# Patient Record
Sex: Male | Born: 1955 | Race: Black or African American | Hispanic: No | Marital: Married | Smoking: Never smoker
Health system: Southern US, Community
[De-identification: ages and names within clinical notes are randomized; demographics above are authoritative.]

## PROBLEM LIST (undated history)

## (undated) DIAGNOSIS — I10 Essential (primary) hypertension: Secondary | ICD-10-CM

## (undated) DIAGNOSIS — E7800 Pure hypercholesterolemia, unspecified: Secondary | ICD-10-CM

---

## 2005-12-10 ENCOUNTER — Emergency Department (HOSPITAL_COMMUNITY): Admission: EM | Admit: 2005-12-10 | Discharge: 2005-12-10 | Payer: Self-pay | Admitting: Emergency Medicine

## 2006-12-12 ENCOUNTER — Ambulatory Visit (HOSPITAL_COMMUNITY): Admission: RE | Admit: 2006-12-12 | Discharge: 2006-12-12 | Payer: Self-pay | Admitting: Internal Medicine

## 2009-12-24 ENCOUNTER — Ambulatory Visit (HOSPITAL_COMMUNITY): Admission: RE | Admit: 2009-12-24 | Discharge: 2009-12-24 | Payer: Self-pay | Admitting: Family Medicine

## 2012-10-24 ENCOUNTER — Other Ambulatory Visit (HOSPITAL_COMMUNITY): Payer: Self-pay | Admitting: Chiropractic Medicine

## 2012-10-24 DIAGNOSIS — R52 Pain, unspecified: Secondary | ICD-10-CM

## 2012-10-26 ENCOUNTER — Ambulatory Visit (HOSPITAL_COMMUNITY)
Admission: RE | Admit: 2012-10-26 | Discharge: 2012-10-26 | Disposition: A | Discharge status: Home / Self Care | Payer: 59 | Source: Ambulatory Visit | Attending: Chiropractic Medicine | Admitting: Chiropractic Medicine

## 2012-10-26 DIAGNOSIS — R209 Unspecified disturbances of skin sensation: Secondary | ICD-10-CM | POA: Insufficient documentation

## 2012-10-26 DIAGNOSIS — R52 Pain, unspecified: Secondary | ICD-10-CM

## 2012-10-26 DIAGNOSIS — M25569 Pain in unspecified knee: Secondary | ICD-10-CM | POA: Insufficient documentation

## 2012-10-26 DIAGNOSIS — R29898 Other symptoms and signs involving the musculoskeletal system: Secondary | ICD-10-CM | POA: Insufficient documentation

## 2012-10-26 DIAGNOSIS — S83289A Other tear of lateral meniscus, current injury, unspecified knee, initial encounter: Secondary | ICD-10-CM | POA: Insufficient documentation

## 2012-10-26 DIAGNOSIS — M898X9 Other specified disorders of bone, unspecified site: Secondary | ICD-10-CM | POA: Insufficient documentation

## 2013-04-07 ENCOUNTER — Inpatient Hospital Stay (HOSPITAL_BASED_OUTPATIENT_CLINIC_OR_DEPARTMENT_OTHER)
Admission: EM | Admit: 2013-04-07 | Discharge: 2013-04-09 | DRG: 378 | Disposition: A | Discharge status: Home / Self Care | Payer: 59 | Attending: Internal Medicine | Admitting: Internal Medicine

## 2013-04-07 ENCOUNTER — Encounter (HOSPITAL_BASED_OUTPATIENT_CLINIC_OR_DEPARTMENT_OTHER): Payer: Self-pay

## 2013-04-07 ENCOUNTER — Emergency Department (HOSPITAL_BASED_OUTPATIENT_CLINIC_OR_DEPARTMENT_OTHER): Payer: 59

## 2013-04-07 DIAGNOSIS — K922 Gastrointestinal hemorrhage, unspecified: Principal | ICD-10-CM | POA: Diagnosis present

## 2013-04-07 DIAGNOSIS — Z79899 Other long term (current) drug therapy: Secondary | ICD-10-CM

## 2013-04-07 DIAGNOSIS — K269 Duodenal ulcer, unspecified as acute or chronic, without hemorrhage or perforation: Secondary | ICD-10-CM | POA: Diagnosis present

## 2013-04-07 DIAGNOSIS — K2970 Gastritis, unspecified, without bleeding: Secondary | ICD-10-CM | POA: Diagnosis present

## 2013-04-07 DIAGNOSIS — E538 Deficiency of other specified B group vitamins: Secondary | ICD-10-CM | POA: Diagnosis present

## 2013-04-07 DIAGNOSIS — Z7982 Long term (current) use of aspirin: Secondary | ICD-10-CM

## 2013-04-07 DIAGNOSIS — K921 Melena: Secondary | ICD-10-CM | POA: Diagnosis present

## 2013-04-07 DIAGNOSIS — I10 Essential (primary) hypertension: Secondary | ICD-10-CM | POA: Diagnosis present

## 2013-04-07 DIAGNOSIS — K259 Gastric ulcer, unspecified as acute or chronic, without hemorrhage or perforation: Secondary | ICD-10-CM | POA: Diagnosis present

## 2013-04-07 DIAGNOSIS — E785 Hyperlipidemia, unspecified: Secondary | ICD-10-CM | POA: Diagnosis present

## 2013-04-07 DIAGNOSIS — R000 Tachycardia, unspecified: Secondary | ICD-10-CM | POA: Diagnosis present

## 2013-04-07 DIAGNOSIS — D62 Acute posthemorrhagic anemia: Secondary | ICD-10-CM | POA: Diagnosis present

## 2013-04-07 HISTORY — DX: Pure hypercholesterolemia, unspecified: E78.00

## 2013-04-07 HISTORY — DX: Essential (primary) hypertension: I10

## 2013-04-07 LAB — COMPREHENSIVE METABOLIC PANEL
AST: 18 U/L (ref 0–37)
Albumin: 3.9 g/dL (ref 3.5–5.2)
Alkaline Phosphatase: 48 U/L (ref 39–117)
Calcium: 9.2 mg/dL (ref 8.4–10.5)
GFR calc Af Amer: >90 mL/min (ref >90)
GFR calc non Af Amer: 80 mL/min — ABNORMAL LOW (ref >90)
Potassium: 4 mEq/L (ref 3.5–5.1)

## 2013-04-07 LAB — CBC WITH DIFFERENTIAL/PLATELET
Basophils Absolute: 0 10*3/uL (ref 0.0–0.1)
Basophils Relative: 0 % (ref 0–1)
Eosinophils Absolute: 0.1 10*3/uL (ref 0.0–0.7)
Eosinophils Relative: 0 % (ref 0–5)
Lymphocytes Relative: 16 % (ref 12–46)
MCHC: 35 g/dL (ref 30.0–36.0)
MCV: 90.5 fL (ref 78.0–100.0)
Monocytes Absolute: 0.9 10*3/uL (ref 0.1–1.0)
Neutro Abs: 9.4 10*3/uL — ABNORMAL HIGH (ref 1.7–7.7)
Neutrophils Relative %: 76 % (ref 43–77)
Platelets: 209 10*3/uL (ref 150–400)
RDW: 11.6 % (ref 11.5–15.5)

## 2013-04-07 MED ORDER — SODIUM CHLORIDE 0.9 % IV SOLN
80.0000 mg | Freq: Once | INTRAVENOUS | Status: AC
  Administered 2013-04-07: 80 mg via INTRAVENOUS
  Filled 2013-04-07: qty 80

## 2013-04-07 MED ORDER — SODIUM CHLORIDE 0.9 % IV SOLN
8.0000 mg/h | INTRAVENOUS | Status: DC
  Administered 2013-04-07 – 2013-04-08 (×3): 8 mg/h via INTRAVENOUS
  Filled 2013-04-07 (×5): qty 80

## 2013-04-07 MED ORDER — SODIUM CHLORIDE 0.9 % IV BOLUS (SEPSIS)
1000.0000 mL | Freq: Once | INTRAVENOUS | Status: AC
  Administered 2013-04-07: 1000 mL via INTRAVENOUS

## 2013-04-07 NOTE — ED Notes (Signed)
Patient transported to X-ray via stretcher per tech. 

## 2013-04-07 NOTE — ED Provider Notes (Signed)
CSN: [id number]     Arrival date & time 04/07/13  2026 History  This chart was scribed for Gerhard Munch, MD by Arlan Organ, ED Scribe. This patient was seen in room MH05/MH05 and the patient's care was started at 8:38 PM.    Chief Complaint  Patient presents with  . Dizziness  . Near Syncope    The history is provided by the patient. No language interpreter was used.   HPI Comments: Corey Wright is a 57 y.o. male who presents to the Emergency Department complaining of upper and lower abdominal pain that started 1 week ago with associated black stool that started 4 days ago. Pt states he has felt tired for the past week. Pt states he had dizziness and a near syncope episode today. Pt denies dyspnea, fever, confusion, swelling, rash, visual disturbance, and CP. Pt currently takes 81 mg of aspirin every day.    Past Medical History  Diagnosis Date  . Hypertension   . Hypercholesteremia    History reviewed. No pertinent past surgical history. No family history on file. History  Substance Use Topics  . Smoking status: Never Smoker   . Smokeless tobacco: Not on file  . Alcohol Use: No    Review of Systems  Constitutional:       Per HPI, otherwise negative  HENT:       Per HPI, otherwise negative  Respiratory:       Per HPI, otherwise negative  Cardiovascular:       Per HPI, otherwise negative  Gastrointestinal: Negative for vomiting.  Endocrine:       Negative aside from HPI  Genitourinary:       Neg aside from HPI   Musculoskeletal:       Per HPI, otherwise negative  Skin: Negative.   Neurological: Negative for syncope.    Allergies  Review of patient's allergies indicates no known allergies.  Home Medications   Current Outpatient Rx  Name  Route  Sig  Dispense  Refill  . lisinopril-hydrochlorothiazide (PRINZIDE,ZESTORETIC) 10-12.5 MG per tablet   Oral   Take 1 tablet by mouth daily.         Marland Kitchen lovastatin (MEVACOR) 20 MG tablet   Oral   Take 20 mg by  mouth at bedtime.         . tadalafil (CIALIS) 5 MG tablet   Oral   Take 5 mg by mouth daily as needed for erectile dysfunction.          BP 134/84  Pulse 110  Temp(Src) 98.2 F (36.8 C) (Oral)  Resp 18  Ht 5\' 10"  (1.778 m)  Wt 185 lb (83.915 kg)  BMI 26.54 kg/m2 Physical Exam  Nursing note and vitals reviewed. Constitutional: He is oriented to person, place, and time. He appears well-developed. No distress.  HENT:  Head: Normocephalic and atraumatic.  Eyes: Conjunctivae and EOM are normal.  Cardiovascular: Regular rhythm.  Tachycardia present.   Pulmonary/Chest: Effort normal. No stridor. No respiratory distress.  Abdominal: He exhibits no distension.  Genitourinary:  hemoccult grossly negative, but Hemoccult positive  Musculoskeletal: He exhibits no edema.  Neurological: He is alert and oriented to person, place, and time.  Skin: Skin is warm and dry.  Psychiatric: He has a normal mood and affect.    ED Course  Procedures (including critical care time)  DIAGNOSTIC STUDIES: Oxygen Saturation is 98% on ra, nml by my interpretation.    COORDINATION OF CARE: 8:45 PM-Discussed treatment plan which includes  rectal exam. Pt agreed to plan.   10:50 PM Patient in no distress.  ECG w SR, rate 88, unremarkable.   Labs Review Labs Reviewed - No data to display Imaging Review No results found.  MDM  No diagnosis found. I personally performed the services described in this documentation, which was scribed in my presence. The recorded information has been reviewed and is accurate.  This patient presents with fatigue, several days of black stool, and one episode of near syncope.  On exam he is awake alert, but tachycardic.  The patient has Hemoccult positive stool, anemia, and although he has one prior colonoscopy, there is concern for ongoing GI bleed. Patient was admitted for further evaluation and management.  Gerhard Munch, MD 04/07/13 2255

## 2013-04-07 NOTE — ED Notes (Signed)
Pt placed on cardiac and pulse ox monitoring.   

## 2013-04-07 NOTE — ED Notes (Signed)
MD at bedside. 

## 2013-04-07 NOTE — ED Notes (Signed)
Pt reports 4-5 days of black stools, dizziness and one episode of near syncope today.  Has had nausea, pain in abdomen.  Denies cp.

## 2013-04-08 ENCOUNTER — Encounter (HOSPITAL_COMMUNITY)
Admission: EM | Disposition: A | Discharge status: Home / Self Care | Payer: 59 | Source: Home / Self Care | Attending: Internal Medicine

## 2013-04-08 ENCOUNTER — Encounter (HOSPITAL_COMMUNITY): Payer: Self-pay | Admitting: *Deleted

## 2013-04-08 DIAGNOSIS — K922 Gastrointestinal hemorrhage, unspecified: Secondary | ICD-10-CM | POA: Diagnosis present

## 2013-04-08 DIAGNOSIS — D62 Acute posthemorrhagic anemia: Secondary | ICD-10-CM | POA: Diagnosis present

## 2013-04-08 DIAGNOSIS — I10 Essential (primary) hypertension: Secondary | ICD-10-CM

## 2013-04-08 DIAGNOSIS — E785 Hyperlipidemia, unspecified: Secondary | ICD-10-CM | POA: Diagnosis present

## 2013-04-08 HISTORY — PX: ESOPHAGOGASTRODUODENOSCOPY: SHX5428

## 2013-04-08 LAB — IRON AND TIBC
Iron: 36 ug/dL — ABNORMAL LOW (ref 42–135)
Saturation Ratios: 14 % — ABNORMAL LOW (ref 20–55)

## 2013-04-08 LAB — RETICULOCYTES
RBC.: 2.46 MIL/uL — ABNORMAL LOW (ref 4.22–5.81)
Retic Count, Absolute: 113.2 10*3/uL (ref 19.0–186.0)

## 2013-04-08 LAB — CBC
HCT: 21.5 % — ABNORMAL LOW (ref 39.0–52.0)
Hemoglobin: 7.7 g/dL — ABNORMAL LOW (ref 13.0–17.0)
Platelets: 159 10*3/uL (ref 150–400)
RBC: 2.39 MIL/uL — ABNORMAL LOW (ref 4.22–5.81)
RDW: 12.4 % (ref 11.5–15.5)
WBC: 9.3 10*3/uL (ref 4.0–10.5)

## 2013-04-08 LAB — COMPREHENSIVE METABOLIC PANEL
ALT: 9 U/L (ref 0–53)
Alkaline Phosphatase: 36 U/L — ABNORMAL LOW (ref 39–117)
CO2: 23 mEq/L (ref 19–32)
Chloride: 104 mEq/L (ref 96–112)
GFR calc Af Amer: >90 mL/min (ref >90)
GFR calc non Af Amer: 88 mL/min — ABNORMAL LOW (ref >90)
Glucose, Bld: 110 mg/dL — ABNORMAL HIGH (ref 70–99)
Potassium: 4.2 mEq/L (ref 3.5–5.1)
Sodium: 134 mEq/L — ABNORMAL LOW (ref 135–145)

## 2013-04-08 LAB — FOLATE: Folate: 10.5 ng/mL

## 2013-04-08 LAB — PROTIME-INR
INR: 1.2 (ref 0.00–1.49)
Prothrombin Time: 14.9 seconds (ref 11.6–15.2)

## 2013-04-08 LAB — GLUCOSE, CAPILLARY: Glucose-Capillary: 165 mg/dL — ABNORMAL HIGH (ref 70–99)

## 2013-04-08 LAB — HEMOGLOBIN AND HEMATOCRIT, BLOOD: HCT: 21.6 % — ABNORMAL LOW (ref 39.0–52.0)

## 2013-04-08 LAB — MRSA PCR SCREENING: MRSA by PCR: NEGATIVE

## 2013-04-08 SURGERY — EGD (ESOPHAGOGASTRODUODENOSCOPY)
Anesthesia: Moderate Sedation

## 2013-04-08 MED ORDER — DIPHENHYDRAMINE HCL 50 MG/ML IJ SOLN
INTRAMUSCULAR | Status: AC
  Filled 2013-04-08: qty 1

## 2013-04-08 MED ORDER — SODIUM CHLORIDE 0.9 % IV SOLN: INTRAVENOUS | Status: DC

## 2013-04-08 MED ORDER — ONDANSETRON HCL 4 MG PO TABS: 4.0000 mg | ORAL_TABLET | Freq: Four times a day (QID) | ORAL | Status: DC | PRN

## 2013-04-08 MED ORDER — SODIUM CHLORIDE 0.9 % IJ SOLN
3.0000 mL | Freq: Two times a day (BID) | INTRAMUSCULAR | Status: DC
  Administered 2013-04-08 – 2013-04-09 (×2): 3 mL via INTRAVENOUS

## 2013-04-08 MED ORDER — ONDANSETRON HCL 4 MG/2ML IJ SOLN: 4.0000 mg | Freq: Four times a day (QID) | INTRAMUSCULAR | Status: DC | PRN

## 2013-04-08 MED ORDER — SODIUM CHLORIDE 0.9 % IV SOLN
INTRAVENOUS | Status: AC
  Administered 2013-04-08: 06:00:00 via INTRAVENOUS
  Administered 2013-04-09: 100 mL/h via INTRAVENOUS

## 2013-04-08 MED ORDER — ACETAMINOPHEN 650 MG RE SUPP: 650.0000 mg | Freq: Four times a day (QID) | RECTAL | Status: DC | PRN

## 2013-04-08 MED ORDER — FENTANYL CITRATE 0.05 MG/ML IJ SOLN
INTRAMUSCULAR | Status: DC | PRN
  Administered 2013-04-08 (×2): 25 ug via INTRAVENOUS

## 2013-04-08 MED ORDER — FENTANYL CITRATE 0.05 MG/ML IJ SOLN
INTRAMUSCULAR | Status: AC
  Filled 2013-04-08: qty 4

## 2013-04-08 MED ORDER — ACETAMINOPHEN 325 MG PO TABS: 650.0000 mg | ORAL_TABLET | Freq: Four times a day (QID) | ORAL | Status: DC | PRN

## 2013-04-08 MED ORDER — BUTAMBEN-TETRACAINE-BENZOCAINE 2-2-14 % EX AERO
INHALATION_SPRAY | CUTANEOUS | Status: DC | PRN
  Administered 2013-04-08: 2 via TOPICAL

## 2013-04-08 MED ORDER — MIDAZOLAM HCL 10 MG/2ML IJ SOLN
INTRAMUSCULAR | Status: DC | PRN
  Administered 2013-04-08 (×2): 2 mg via INTRAVENOUS

## 2013-04-08 MED ORDER — MIDAZOLAM HCL 5 MG/ML IJ SOLN
INTRAMUSCULAR | Status: AC
  Filled 2013-04-08: qty 2

## 2013-04-08 NOTE — Progress Notes (Signed)
To Endo by bed. Permit sent with patient in chart. States no one explained the procedure  To him.

## 2013-04-08 NOTE — H&P (Signed)
[facility] History and Physical  Corey Wright JWJ:[id number] DOB: Sep 09, 1955 DOA: 04/07/2013  Referring physician: Patient was transferred from [facility] PCP: Evlyn Courier, MD  Specialists: Dr. Elnoria Howard. Gastroenterologist.  Chief Complaint: Dizziness and black stools.  HPI: Corey Wright is a 57 y.o. male with history of hypertension and hyperlipidemia presented to the ER because of weakness and dizziness with black stools. Patient has been experiencing dizziness and weakness for the last 4 days and for the same duration of time patient has been having black stools. Patient uses aspirin prophylactically and also uses NSAIDs off and on for musculoskeletal pain. In the ER patient was found to be anemic and initially tachycardic. Patient has been admitted for acute GI bleed. Patient has been started on IV Protonix. Patient states that he has had some abdominal discomfort 2 days ago but presently denies any abdominal pain. He also had some diarrhea 4 days ago which has resolved. Denies using any antibiotics recently. Denies any chest pain or shortness of breath.  Review of Systems: As presented in the history of presenting illness, rest negative.  Past Medical History  Diagnosis Date  . Hypertension   . Hypercholesteremia    History reviewed. No pertinent past surgical history. Social History:  reports that he has never smoked. He does not have any smokeless tobacco history on file. He reports that he does not drink alcohol or use illicit drugs. Home. where does patient live-- yes. Can patient participate in ADLs?  No Known Allergies  Family History  Problem Relation Age of Onset  . Diabetes Mellitus II Mother       Prior to Admission medications   Medication Sig Start Date End Date Taking? Authorizing Provider  lisinopril-hydrochlorothiazide (PRINZIDE,ZESTORETIC) 10-12.5 MG per tablet Take 1 tablet by mouth daily.   Yes Historical Provider, MD  lovastatin  (MEVACOR) 20 MG tablet Take 20 mg by mouth at bedtime.   Yes Historical Provider, MD  tadalafil (CIALIS) 5 MG tablet Take 5 mg by mouth daily as needed for erectile dysfunction.   Yes Historical Provider, MD   Physical Exam: Filed Vitals:   04/07/13 2035 04/07/13 2325 04/08/13 0251 04/08/13 0355  BP: 134/84 131/67 112/58 123/66  Pulse: 110 83 79 93  Temp: 98.2 F (36.8 C) 97 F (36.1 C) 98 F (36.7 C) 98.5 F (36.9 C)  TempSrc: Oral Oral Oral Oral  Resp: 18 16 18 15   Height: 5\' 10"  (1.778 m)   5\' 10"  (1.778 m)  Weight: 83.915 kg (185 lb)   79.3 kg (174 lb 13.2 oz)  SpO2:  100% 100% 100%     General:   Well-developed and nourished.  Eyes:  Anicteric no pallor.  ENT:  No discharge from ears eyes nose mouth.  Neck:  No mass felt.  Cardiovascular:  S1-S2 heard.  Respiratory:  No rhonchi or crepitations.  Abdomen:  Soft nontender bowel sounds present.  Skin:  No rash.  Musculoskeletal:  No edema.  Psychiatric:  Appears normal.  Neurologic:  Alert awake oriented to time place and person. Moves all extremities.  Labs on Admission:  Basic Metabolic Panel:  Recent Labs Lab 04/07/13 2057  NA 135  K 4.0  CL 99  CO2 24  GLUCOSE 148*  BUN 23  CREATININE 1.00  CALCIUM 9.2   Liver Function Tests:  Recent Labs Lab 04/07/13 2057  AST 18  ALT 12  ALKPHOS 48  BILITOT 0.3  PROT 6.7  ALBUMIN 3.9   No results  found for this basename: LIPASE, AMYLASE,  in the last 168 hours No results found for this basename: AMMONIA,  in the last 168 hours CBC:  Recent Labs Lab 04/07/13 2057  WBC 12.4*  NEUTROABS 9.4*  HGB 9.3*  HCT 26.6*  MCV 90.5  PLT 209   Cardiac Enzymes: No results found for this basename: CKTOTAL, CKMB, CKMBINDEX, TROPONINI,  in the last 168 hours  BNP (last 3 results) No results found for this basename: PROBNP,  in the last 8760 hours CBG: No results found for this basename: GLUCAP,  in the last 168 hours  Radiological Exams on  Admission: Dg Chest 2 View  04/07/2013   *RADIOLOGY REPORT*  Clinical Data: Discomfort and abdominal pain.  CHEST - 2 VIEW  Comparison: 12/12/2006  Findings: Two views of the chest demonstrate clear lungs. Heart and mediastinum are within normal limits.  The trachea is midline. Bony thorax is intact.  IMPRESSION: No acute chest findings.   Original Report Authenticated By: Richarda Overlie, M.D.     Assessment/Plan Principal Problem:   GI bleed Active Problems:   Acute blood loss anemia   HTN (hypertension)   HLD (hyperlipidemia)   1. Acute GI bleed most likely source is upper GI - patient has been placed n.p.o. in anticipation of possible EGD. Continue with Protonix infusion. Recheck CBC and type and crossmatch and hold PRBC. Transfuse as needed if hemoglobin falls less than 7 of patient gets hemodynamically unstable. Consult Dr. Elnoria Howard, patient's gastroenterologist in a.m. Patient has had a colonoscopy last year and as per patient was normal. 2. Acute blood loss anemia - closely follow CBC. 3. Hypertension - since patient was symptomatic with dizziness we will hold antihypertensives for now and place patient on when necessary hydralazine for systolic blood pressure more than 180. 4. Hyperlipidemia - continue statins once patient starts oral medications.    Code Status:  Full code.  Family Communication:  Patient's wife at the bedside.  Disposition Plan:  Admit to inpatient.    Deepa Barthel N. [facility] Pager [phone].  If 7PM-7AM, please contact night-coverage [email or web address] Password [facility] 04/08/2013, 4:47 AM

## 2013-04-08 NOTE — Progress Notes (Signed)
Patient admitted early this AM by Dr. K- Dr. Mann consulted.  Transfuse if < 7.  Leave in SDU. H/h q 12   Laronn Devonshire DO 

## 2013-04-08 NOTE — H&P (View-Only) (Signed)
Patient admitted early this AM by Dr. Kirtland Bouchard- Dr. Loreta Ave consulted.  Transfuse if < 7.  Leave in SDU. H/h q 12   Marlin Canary DO

## 2013-04-08 NOTE — Op Note (Signed)
[facility] [address] [place], [address]   OPERATIVE PROCEDURE REPORT  PATIENT: Corey, Wright  MR#: [id number] BIRTHDATE: June 11, 1956  GENDER: Male ENDOSCOPIST: Jeani Hawking, MD ASSISTANT:   Jadene Pierini, technician and Karoline Caldwell, RN, BSN PROCEDURE DATE: 04/08/2013 PROCEDURE:   EGD with biopsies ASA CLASS:   II INDICATIONS: Melena, Anemia, and ABM pain MEDICATIONS: Versed 4 mg IV and Fentanyl 50 mcg IV TOPICAL ANESTHETIC:   Cetacaine Spray  DESCRIPTION OF PROCEDURE:   After the risks benefits and alternatives of the procedure were thoroughly explained, informed consent was obtained.  The Pentax Gastroscope [id number]  endoscope was introduced through the mouth  and advanced to the second portion of the duodenum Without limitations.      The instrument was slowly withdrawn as the mucosa was fully examined.      FINDINGS: The esopahgus was normal.  In the gastric antrum a small nonbleeding, clean-based ulcer was identified.  A mild gastritis was also noted in the antrum and multiple cold biopsies were obtained.  IN the duodenal bulb a 5 mm clean-based ulcer was found. The duodenal bulb was deformed.  No evidence of any recent bleeding or old blood.  No evidence of masses or vascular abnormalities.          The scope was then withdrawn from the patient and the procedure terminated.  COMPLICATIONS: There were no complications. IMPRESSION: 1) Gastric ulcer. 2) Duodenal ulcer. 3) Gastritis.  RECOMMENDATIONS: 1) Await biopsy results. 2) No NSAIDs. 3) PPI BID x 1 month and then QD.  _______________________________ eSignedJeani Hawking, MD 04/08/2013 11:26 AM

## 2013-04-08 NOTE — Consult Note (Signed)
Reason for Consult: Melena, Anemia, and ABM pain Referring Physician: [facility]  Mack Guise HPI: This is a 57 year old male with complaints of abdominal pain and melena.  His symptoms started 4 days ago and he initially had mid abdominal pain that then radiated up into the chest region.  A couple of weeks ago he had a minor issue with GERD.  The only NSAID he uses on a chronic basis is ASA.  No prior history of PUD and he denies any problems with nausea or vomiting.  Along with his abdominal pain he noticed black stools.  His melena started before the use of Peptobismol.  He presented to the ER with complaints of dizziness and fatigue.  Past Medical History  Diagnosis Date  . Hypertension   . Hypercholesteremia     History reviewed. No pertinent past surgical history.  Family History  Problem Relation Age of Onset  . Diabetes Mellitus II Mother     Social History:  reports that he has never smoked. He does not have any smokeless tobacco history on file. He reports that he does not drink alcohol or use illicit drugs.  Allergies: No Known Allergies  Medications:  Scheduled: . sodium chloride  3 mL Intravenous Q12H   Continuous: . sodium chloride 100 mL/hr at 04/08/13 0533  . sodium chloride    . pantoprozole (PROTONIX) infusion 8 mg/hr (04/08/13 0935)    Results for orders placed during the hospital encounter of 04/07/13 (from the past 24 hour(s))  CBC WITH DIFFERENTIAL     Status: Abnormal   Collection Time    04/07/13  8:57 PM      Result Value Range   WBC 12.4 (*) 4.0 - 10.5 K/uL   RBC 2.94 (*) 4.22 - 5.81 MIL/uL   Hemoglobin 9.3 (*) 13.0 - 17.0 g/dL   HCT 16.1 (*) 09.6 - 04.5 %   MCV 90.5  78.0 - 100.0 fL   MCH 31.6  26.0 - 34.0 pg   MCHC 35.0  30.0 - 36.0 g/dL   RDW 40.9  81.1 - 91.4 %   Platelets 209  150 - 400 K/uL   Neutrophils Relative % 76  43 - 77 %   Neutro Abs 9.4 (*) 1.7 - 7.7 K/uL   Lymphocytes Relative 16  12 - 46 %   Lymphs Abs 1.9  0.7 -  4.0 K/uL   Monocytes Relative 8  3 - 12 %   Monocytes Absolute 0.9  0.1 - 1.0 K/uL   Eosinophils Relative 0  0 - 5 %   Eosinophils Absolute 0.1  0.0 - 0.7 K/uL   Basophils Relative 0  0 - 1 %   Basophils Absolute 0.0  0.0 - 0.1 K/uL  COMPREHENSIVE METABOLIC PANEL     Status: Abnormal   Collection Time    04/07/13  8:57 PM      Result Value Range   Sodium 135  135 - 145 mEq/L   Potassium 4.0  3.5 - 5.1 mEq/L   Chloride 99  96 - 112 mEq/L   CO2 24  19 - 32 mEq/L   Glucose, Bld 148 (*) 70 - 99 mg/dL   BUN 23  6 - 23 mg/dL   Creatinine, Ser 7.82  0.50 - 1.35 mg/dL   Calcium 9.2  8.4 - 95.6 mg/dL   Total Protein 6.7  6.0 - 8.3 g/dL   Albumin 3.9  3.5 - 5.2 g/dL   AST 18  0 -  37 U/L   ALT 12  0 - 53 U/L   Alkaline Phosphatase 48  39 - 117 U/L   Total Bilirubin 0.3  0.3 - 1.2 mg/dL   GFR calc non Af Amer 80 (*) >90 mL/min   GFR calc Af Amer >90  >90 mL/min  MRSA PCR SCREENING     Status: None   Collection Time    04/08/13  4:04 AM      Result Value Range   MRSA by PCR NEGATIVE  NEGATIVE  ABO/RH     Status: None   Collection Time    04/08/13  6:26 AM      Result Value Range   ABO/RH(D) O POS    CBC     Status: Abnormal   Collection Time    04/08/13  6:29 AM      Result Value Range   WBC 9.3  4.0 - 10.5 K/uL   RBC 2.46 (*) 4.22 - 5.81 MIL/uL   Hemoglobin 7.8 (*) 13.0 - 17.0 g/dL   HCT 16.1 (*) 09.6 - 04.5 %   MCV 87.4  78.0 - 100.0 fL   MCH 31.7  26.0 - 34.0 pg   MCHC 36.3 (*) 30.0 - 36.0 g/dL   RDW 40.9  81.1 - 91.4 %   Platelets 159  150 - 400 K/uL  COMPREHENSIVE METABOLIC PANEL     Status: Abnormal   Collection Time    04/08/13  6:29 AM      Result Value Range   Sodium 134 (*) 135 - 145 mEq/L   Potassium 4.2  3.5 - 5.1 mEq/L   Chloride 104  96 - 112 mEq/L   CO2 23  19 - 32 mEq/L   Glucose, Bld 110 (*) 70 - 99 mg/dL   BUN 16  6 - 23 mg/dL   Creatinine, Ser 7.82  0.50 - 1.35 mg/dL   Calcium 8.4  8.4 - 95.6 mg/dL   Total Protein 5.5 (*) 6.0 - 8.3 g/dL   Albumin  3.1 (*) 3.5 - 5.2 g/dL   AST 11  0 - 37 U/L   ALT 9  0 - 53 U/L   Alkaline Phosphatase 36 (*) 39 - 117 U/L   Total Bilirubin 0.4  0.3 - 1.2 mg/dL   GFR calc non Af Amer 88 (*) >90 mL/min   GFR calc Af Amer >90  >90 mL/min  TYPE AND SCREEN     Status: None   Collection Time    04/08/13  6:29 AM      Result Value Range   ABO/RH(D) O POS     Antibody Screen NEG     Sample Expiration 04/11/2013    PROTIME-INR     Status: None   Collection Time    04/08/13  6:29 AM      Result Value Range   Prothrombin Time 14.9  11.6 - 15.2 seconds   INR 1.20  0.00 - 1.49  RETICULOCYTES     Status: Abnormal   Collection Time    04/08/13  6:29 AM      Result Value Range   Retic Ct Pct 4.6 (*) 0.4 - 3.1 %   RBC. 2.46 (*) 4.22 - 5.81 MIL/uL   Retic Count, Manual 113.2  19.0 - 186.0 K/uL     Dg Chest 2 View  04/07/2013   *RADIOLOGY REPORT*  Clinical Data: Discomfort and abdominal pain.  CHEST - 2 VIEW  Comparison: 12/12/2006  Findings: Two views of  the chest demonstrate clear lungs. Heart and mediastinum are within normal limits.  The trachea is midline. Bony thorax is intact.  IMPRESSION: No acute chest findings.   Original Report Authenticated By: Richarda Overlie, M.D.    ROS:  As stated above in the HPI otherwise negative.  Blood pressure 126/67, pulse 86, temperature 98.4 F (36.9 C), temperature source Oral, resp. rate 18, height 5\' 10"  (1.778 m), weight 174 lb 13.2 oz (79.3 kg), SpO2 100.00%.    PE: Gen: NAD, Alert and Oriented HEENT:  [place]/AT, EOMI Neck: Supple, no LAD Lungs: CTA Bilaterally CV: RRR without M/G/R ABM: Soft, NTND, +BS Ext: No C/C/E  Assessment/Plan: 1) Anemia. 2) ABM pain. 3) Melena.   No abdominal pain at this time, but with his history of presentation PUD is suspected.  I will perform an EGD for further evaluation.  Plan: 1) EGD now and further recommendations pending the findings.  Iran Rowe D 04/08/2013, 10:58 AM

## 2013-04-08 NOTE — Interval H&P Note (Signed)
History and Physical Interval Note:  04/08/2013 10:57 AM  Corey Wright  has presented today for surgery, with the diagnosis of GI bleed  The various methods of treatment have been discussed with the patient and family. After consideration of risks, benefits and other options for treatment, the patient has consented to  Procedure(s): ESOPHAGOGASTRODUODENOSCOPY (EGD) (N/A) as a surgical intervention .  The patient's history has been reviewed, patient examined, no change in status, stable for surgery.  I have reviewed the patient's chart and labs.  Questions were answered to the patient's satisfaction.     Wakisha Alberts D

## 2013-04-09 ENCOUNTER — Encounter (HOSPITAL_COMMUNITY): Payer: Self-pay | Admitting: Gastroenterology

## 2013-04-09 DIAGNOSIS — E538 Deficiency of other specified B group vitamins: Secondary | ICD-10-CM | POA: Diagnosis present

## 2013-04-09 LAB — CBC
HCT: 19.4 % — ABNORMAL LOW (ref 39.0–52.0)
MCHC: 36.1 g/dL — ABNORMAL HIGH (ref 30.0–36.0)
RDW: 13 % (ref 11.5–15.5)

## 2013-04-09 LAB — BASIC METABOLIC PANEL
BUN: 12 mg/dL (ref 6–23)
Calcium: 8.2 mg/dL — ABNORMAL LOW (ref 8.4–10.5)
GFR calc Af Amer: 80 mL/min — ABNORMAL LOW (ref >90)
GFR calc non Af Amer: 69 mL/min — ABNORMAL LOW (ref >90)
Potassium: 3.9 mEq/L (ref 3.5–5.1)
Sodium: 138 mEq/L (ref 135–145)

## 2013-04-09 LAB — GLUCOSE, CAPILLARY

## 2013-04-09 LAB — PREPARE RBC (CROSSMATCH)

## 2013-04-09 MED ORDER — VITAMIN B-12 1000 MCG PO TABS: 1000.0000 ug | ORAL_TABLET | Freq: Every day | ORAL | Status: DC

## 2013-04-09 MED ORDER — PANTOPRAZOLE SODIUM 40 MG PO TBEC: 40.0000 mg | DELAYED_RELEASE_TABLET | Freq: Two times a day (BID) | ORAL | Status: AC

## 2013-04-09 MED ORDER — FERROUS GLUCONATE 216 MG PO TABS: 216.0000 mg | ORAL_TABLET | Freq: Two times a day (BID) | ORAL | Status: AC

## 2013-04-09 MED ORDER — VITAMIN B-12 1000 MCG PO TABS: 1000.0000 ug | ORAL_TABLET | Freq: Every day | ORAL | Status: AC

## 2013-04-09 MED ORDER — PANTOPRAZOLE SODIUM 40 MG PO TBEC: 40.0000 mg | DELAYED_RELEASE_TABLET | Freq: Two times a day (BID) | ORAL | Status: DC

## 2013-04-09 MED ORDER — PANTOPRAZOLE SODIUM 40 MG PO TBEC
40.0000 mg | DELAYED_RELEASE_TABLET | Freq: Two times a day (BID) | ORAL | Status: DC
  Administered 2013-04-09: 40 mg via ORAL
  Filled 2013-04-09: qty 1

## 2013-04-09 MED ORDER — SUCRALFATE 1 GM/10ML PO SUSP
1.0000 g | Freq: Three times a day (TID) | ORAL | Status: DC
Start: 2013-04-09 — End: 2013-04-09
  Administered 2013-04-09: 1 g via ORAL
  Filled 2013-04-09 (×4): qty 10

## 2013-04-09 MED ORDER — FERROUS GLUCONATE 216 MG PO TABS: 216.0000 mg | ORAL_TABLET | Freq: Two times a day (BID) | ORAL | Status: DC

## 2013-04-09 MED ORDER — SUCRALFATE 1 G PO TABS: 1.0000 g | ORAL_TABLET | Freq: Three times a day (TID) | ORAL | Status: DC

## 2013-04-09 MED ORDER — SUCRALFATE 1 G PO TABS: 1.0000 g | ORAL_TABLET | Freq: Three times a day (TID) | ORAL | Status: AC

## 2013-04-09 NOTE — Progress Notes (Signed)
Subjective: No acute events.  Feels well at this time.  Objective: Vital signs in last 24 hours: Temp:  [97.7 F (36.5 C)-98.5 F (36.9 C)] 98.4 F (36.9 C) (09/10 0700) Pulse Rate:  [68-102] 82 (09/10 0700) Resp:  [11-23] 12 (09/10 0700) BP: (103-126)/(57-83) 113/64 mmHg (09/10 0700) SpO2:  [98 %-100 %] 100 % (09/10 0700) Weight:  [180 lb 1.9 oz (81.7 kg)] 180 lb 1.9 oz (81.7 kg) (09/09 1150) Last BM Date: 04/07/13  Intake/Output from previous day: 09/09 0701 - 09/10 0700 In: 2933 [P.O.:480; I.V.:2453] Out: 1900 [Urine:1900] Intake/Output this shift: Total I/O In: -  Out: 500 [Urine:500]  General appearance: alert and no distress GI: soft, non-tender; bowel sounds normal; no masses,  no organomegaly  Lab Results:  Recent Labs  04/08/13 0629 04/08/13 1340 04/08/13 2219 04/09/13 0445  WBC 9.3 8.2  --  7.1  HGB 7.8* 7.7* 7.8* 7.0*  HCT 21.5* 21.2* 21.6* 19.4*  PLT 159 160  --  149*   BMET  Recent Labs  04/07/13 2057 04/08/13 0629 04/09/13 0445  NA 135 134* 138  K 4.0 4.2 3.9  CL 99 104 109  CO2 24 23 22   GLUCOSE 148* 110* 108*  BUN 23 16 12   CREATININE 1.00 0.99 1.13  CALCIUM 9.2 8.4 8.2*   LFT  Recent Labs  04/08/13 0629  PROT 5.5*  ALBUMIN 3.1*  AST 11  ALT 9  ALKPHOS 36*  BILITOT 0.4   PT/INR  Recent Labs  04/08/13 0629  LABPROT 14.9  INR 1.20   Hepatitis Panel No results found for this basename: HEPBSAG, HCVAB, HEPAIGM, HEPBIGM,  in the last 72 hours C-Diff No results found for this basename: CDIFFTOX,  in the last 72 hours Fecal Lactopherrin No results found for this basename: FECLLACTOFRN,  in the last 72 hours  Studies/Results: Dg Chest 2 View  04/07/2013   *RADIOLOGY REPORT*  Clinical Data: Discomfort and abdominal pain.  CHEST - 2 VIEW  Comparison: 12/12/2006  Findings: Two views of the chest demonstrate clear lungs. Heart and mediastinum are within normal limits.  The trachea is midline. Bony thorax is intact.  IMPRESSION:  No acute chest findings.   Original Report Authenticated By: Richarda Overlie, M.D.    Medications:  Scheduled: . pantoprazole  40 mg Oral BID  . sodium chloride  3 mL Intravenous Q12H  . sucralfate  1 g Oral TID WC & HS   Continuous:   Assessment/Plan: 1) Antral ulcer. 2) Duodenal ulcer.    He is well at this time.  HGB did drop down to 7, but no further melena.  Plan: 1) Agree with blood transfusion. 2) Start sucralfate. 3) Continue with PPI.  LOS: 2 days   Wealthy Danielski D 04/09/2013, 7:55 AM

## 2013-04-09 NOTE — Progress Notes (Signed)
Dr. Isidoro Donning made aware of 1 unit PRBC infused and post h&h stable. Orders to initiate discharge.

## 2013-04-09 NOTE — Progress Notes (Signed)
Pt discharged via wheelchair volunteer service wife accompanied. Discharge instructions and education complete. Pt understands the need to schedule follow up appt and  to call 911 if any emergency occur.

## 2013-04-09 NOTE — Discharge Summary (Signed)
Physician Discharge Summary  Patient ID: Corey Wright MRN: [id number] DOB/AGE: 1955/09/15 57 y.o.  Admit date: 04/07/2013 Discharge date: 04/09/2013  Primary Care Physician:  Evlyn Courier, MD  Discharge Diagnoses:    . GI bleed . Acute blood loss anemia . HTN (hypertension) . HLD (hyperlipidemia) . B12 deficiency  Consults: Gastroenterology, Dr. Elnoria Howard   Recommendations for Outpatient Follow-up:  1. advised the patient to hold lisinopril/HCTZ for a few days.  once BP stable he can restart it  2. followup with gastroenterology in 2-3 weeks  Allergies:  No Known Allergies   Discharge Medications:   Medication List    STOP taking these medications       aspirin 81 MG tablet     ibuprofen 200 MG tablet  Commonly known as:  ADVIL,MOTRIN      TAKE these medications       ferrous gluconate 216 MG tablet  Commonly known as:  FERGON  Take 1 tablet (216 mg total) by mouth 2 (two) times daily with a meal.     lisinopril-hydrochlorothiazide 10-12.5 MG per tablet  Commonly known as:  PRINZIDE,ZESTORETIC  Take 1 tablet by mouth daily.     lovastatin 20 MG tablet  Commonly known as:  MEVACOR  Take 20 mg by mouth at bedtime.     pantoprazole 40 MG tablet  Commonly known as:  PROTONIX  Take 1 tablet (40 mg total) by mouth 2 (two) times daily. Take twice a day for a month and then daily     sucralfate 1 G tablet  Commonly known as:  CARAFATE  Take 1 tablet (1 g total) by mouth 4 (four) times daily -  before meals and at bedtime. For a month     tadalafil 5 MG tablet  Commonly known as:  CIALIS  Take 5 mg by mouth daily as needed for erectile dysfunction.     vitamin B-12 1000 MCG tablet  Commonly known as:  CYANOCOBALAMIN  Take 1 tablet (1,000 mcg total) by mouth daily.         Brief H and P: For complete details please refer to admission H and P, but in brief Corey Wright is a 57 y.o. male with history of hypertension and hyperlipidemia presented to the ER  because of weakness and dizziness with black stools. Patient had been experiencing dizziness and weakness for the last 4 days and for the same duration of time patient has been having black stools. Patient used aspirin prophylactically and also used NSAIDs off and on for musculoskeletal pain. In the ER patient was found to be anemic and initially tachycardic. Patient has been admitted for acute GI bleed and started on IV Protonix. He also complained of some abdominal discomfort for the last 2 days.  Hospital Course:   Acute GI bleed: -Patient was admitted to the step down unit. He was placed on n.p.o. status. Gastroenterology was consulted and patient underwent endoscopy on 04/08/13 . EGD showed mild gastritis in the antrum and small nonbleeding, clean-based gastric ulcer. 5 mm clean-based ulcer in the duodenal bulb. Patient was strongly advised to not take aspirin and NSAIDs. He was placed on PPI twice a day for a month and then daily and Carafate was added. He will followup of the biopsy results.. He received one unit of packed RBC transfusion, hemoglobin improved to 8.6 from 7.0 at the time of DC.   Day of Discharge BP 134/67  Pulse 90  Temp(Src) 98 F (36.7 C) (Oral)  Resp 15  Ht 5\' 10"  (1.778 m)  Wt 81.7 kg (180 lb 1.9 oz)  BMI 25.84 kg/m2  SpO2 100%  Physical Exam: General: Alert and awake oriented x3 not in any acute distress. HEENT: anicteric sclera, pupils reactive to light and accommodation CVS: S1-S2 clear no murmur rubs or gallops Chest: clear to auscultation bilaterally, no wheezing rales or rhonchi Abdomen: soft nontender, nondistended, normal bowel sounds, no organomegaly Extremities: no cyanosis, clubbing or edema noted bilaterally Neuro: Cranial nerves II-XII intact, no focal neurological deficits   The results of significant diagnostics from this hospitalization (including imaging, microbiology, ancillary and laboratory) are listed below for reference.    LAB  RESULTS: Basic Metabolic Panel:  Recent Labs Lab 04/08/13 0629 04/09/13 0445  NA 134* 138  K 4.2 3.9  CL 104 109  CO2 23 22  GLUCOSE 110* 108*  BUN 16 12  CREATININE 0.99 1.13  CALCIUM 8.4 8.2*   Liver Function Tests:  Recent Labs Lab 04/07/13 2057 04/08/13 0629  AST 18 11  ALT 12 9  ALKPHOS 48 36*  BILITOT 0.3 0.4  PROT 6.7 5.5*  ALBUMIN 3.9 3.1*   No results found for this basename: LIPASE, AMYLASE,  in the last 168 hours No results found for this basename: AMMONIA,  in the last 168 hours CBC:  Recent Labs Lab 04/07/13 2057  04/08/13 1340  04/09/13 0445 04/09/13 1200  WBC 12.4*  < > 8.2  --  7.1  --   NEUTROABS 9.4*  --   --   --   --   --   HGB 9.3*  < > 7.7*  < > 7.0* 8.6*  HCT 26.6*  < > 21.2*  < > 19.4* 24.2*  MCV 90.5  < > 88.7  --  89.0  --   PLT 209  < > 160  --  149*  --   < > = values in this interval not displayed. Cardiac Enzymes: No results found for this basename: CKTOTAL, CKMB, CKMBINDEX, TROPONINI,  in the last 168 hours BNP: No components found with this basename: POCBNP,  CBG:  Recent Labs Lab 04/09/13 0724 04/09/13 1149  GLUCAP 112* 125*    Significant Diagnostic Studies:  Dg Chest 2 View  04/07/2013   *RADIOLOGY REPORT*  Clinical Data: Discomfort and abdominal pain.  CHEST - 2 VIEW  Comparison: 12/12/2006  Findings: Two views of the chest demonstrate clear lungs. Heart and mediastinum are within normal limits.  The trachea is midline. Bony thorax is intact.  IMPRESSION: No acute chest findings.   Original Report Authenticated By: Richarda Overlie, M.D.    2D ECHO:   Disposition and Follow-up: Discharge Orders   Future Orders Complete By Expires   Diet - low sodium heart healthy  As directed    Discharge instructions  As directed    Comments:     1) Please donot take Aspirin or any NSAID's (alleve, motrin, ibuprofen etc)  2) HOLD your BP medications for a couple of days, check your BP daily. 3) Make appt with Dr Elnoria Howard in 3 weeks, you  may need repeat endoscopy. Call their office on Friday 9/12 or Monday 9/15 for biopsy results.   Increase activity slowly  As directed        DISPOSITION:Home  DIET Heart healthy  ACTIVITY:As tolerated  DISCHARGE FOLLOW-UP Follow-up Information   Follow up with HILL,GERALD K, MD. Schedule an appointment as soon as possible for a visit in 2 weeks.   Specialty:  Family Medicine   Contact information:   [address] [place] [address] [phone]       Follow up with HUNG,PATRICK D, MD. Schedule an appointment as soon as possible for a visit in 3 weeks. (GI, for follow-up)    Specialty:  Gastroenterology   Contact information:   [address] [place] [address] [phone]       Time spent on Discharge: 40 mins  Signed:   Timberlyn Pickford M.D. [facility] 04/09/2013, 1:37 PM Pager: [phone]

## 2013-04-10 LAB — TYPE AND SCREEN

## 2015-08-11 IMAGING — CR DG CHEST 2V
2 series · 2 of 2 positions shown · non-contrast
Comparison: 12/12/2006

CLINICAL DATA: Discomfort and abdominal pain.

CHEST - 2 VIEW

[w chest pa]
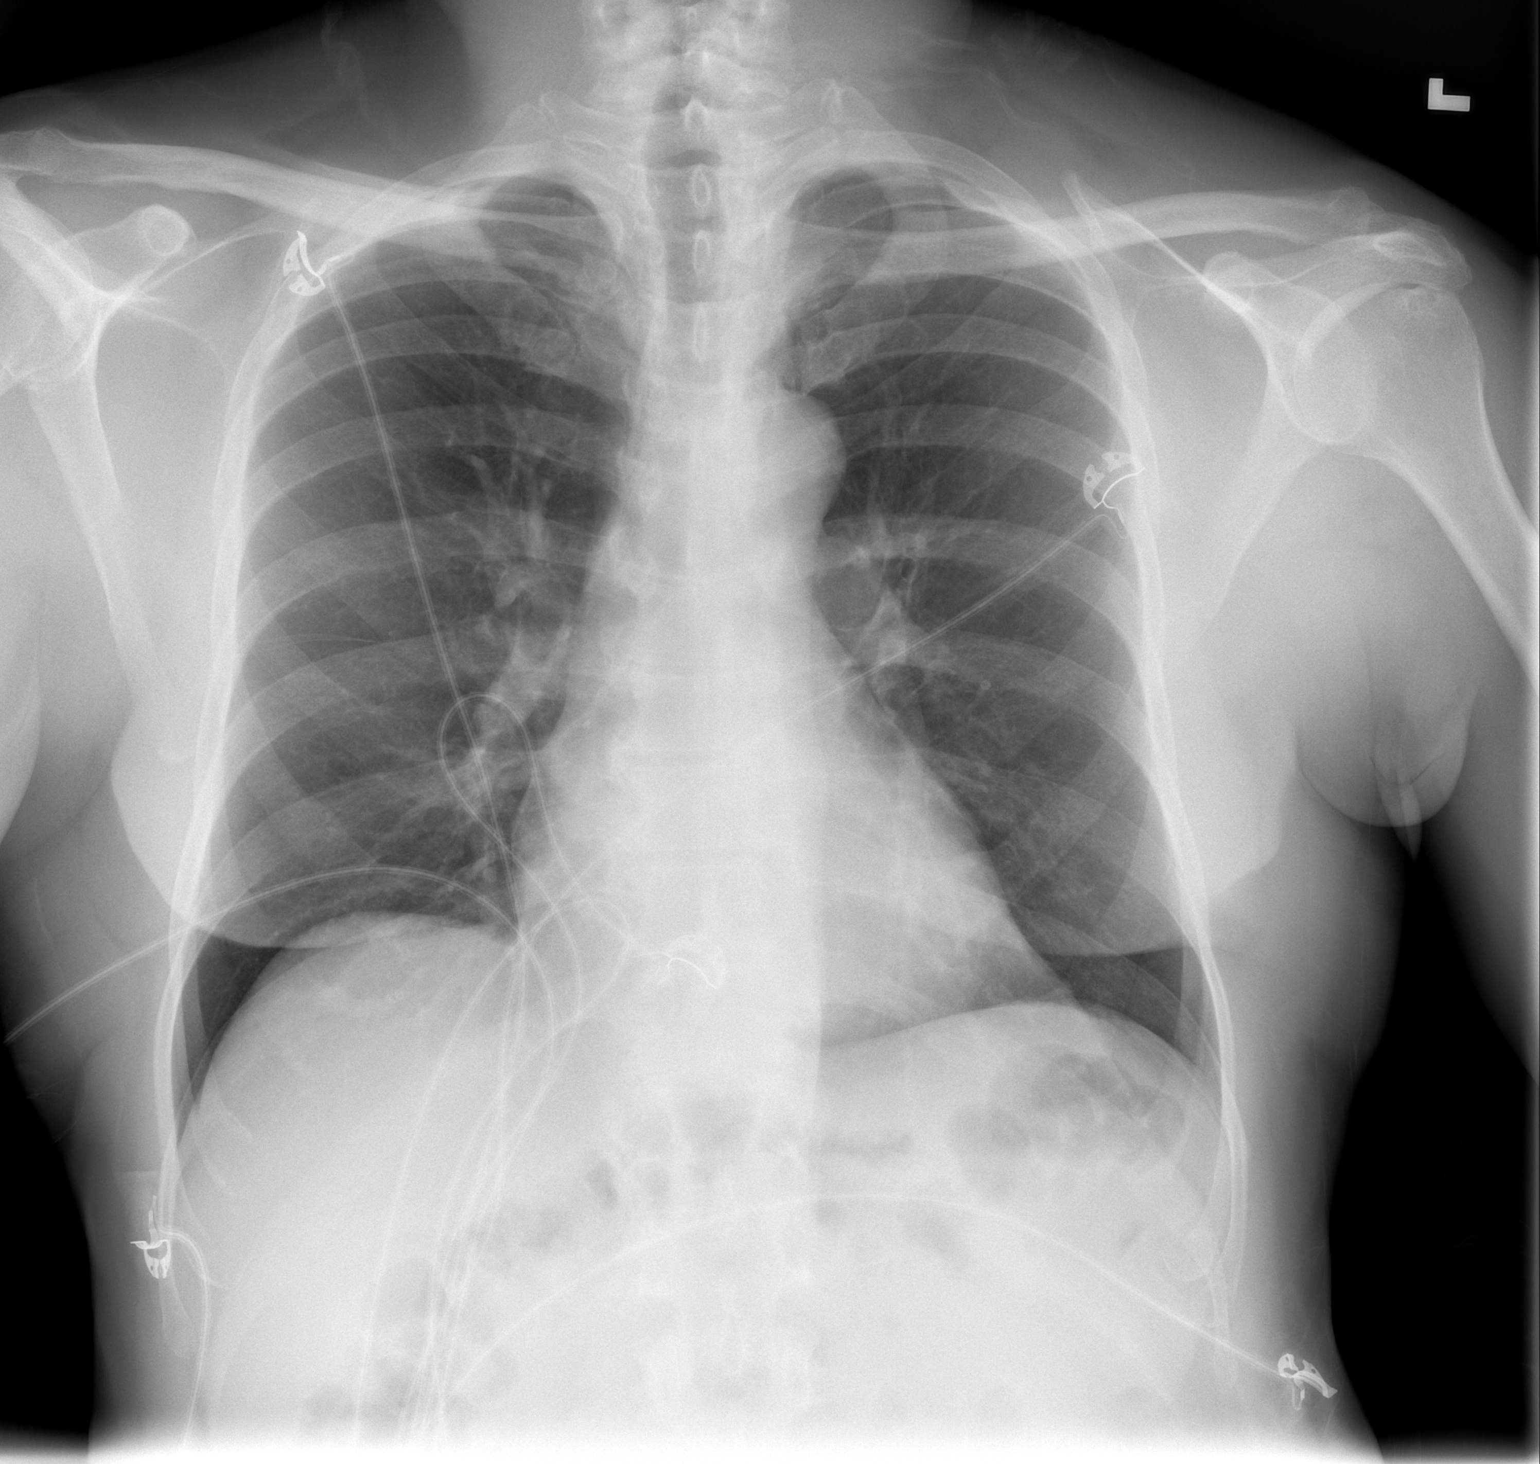

[w chest lat]
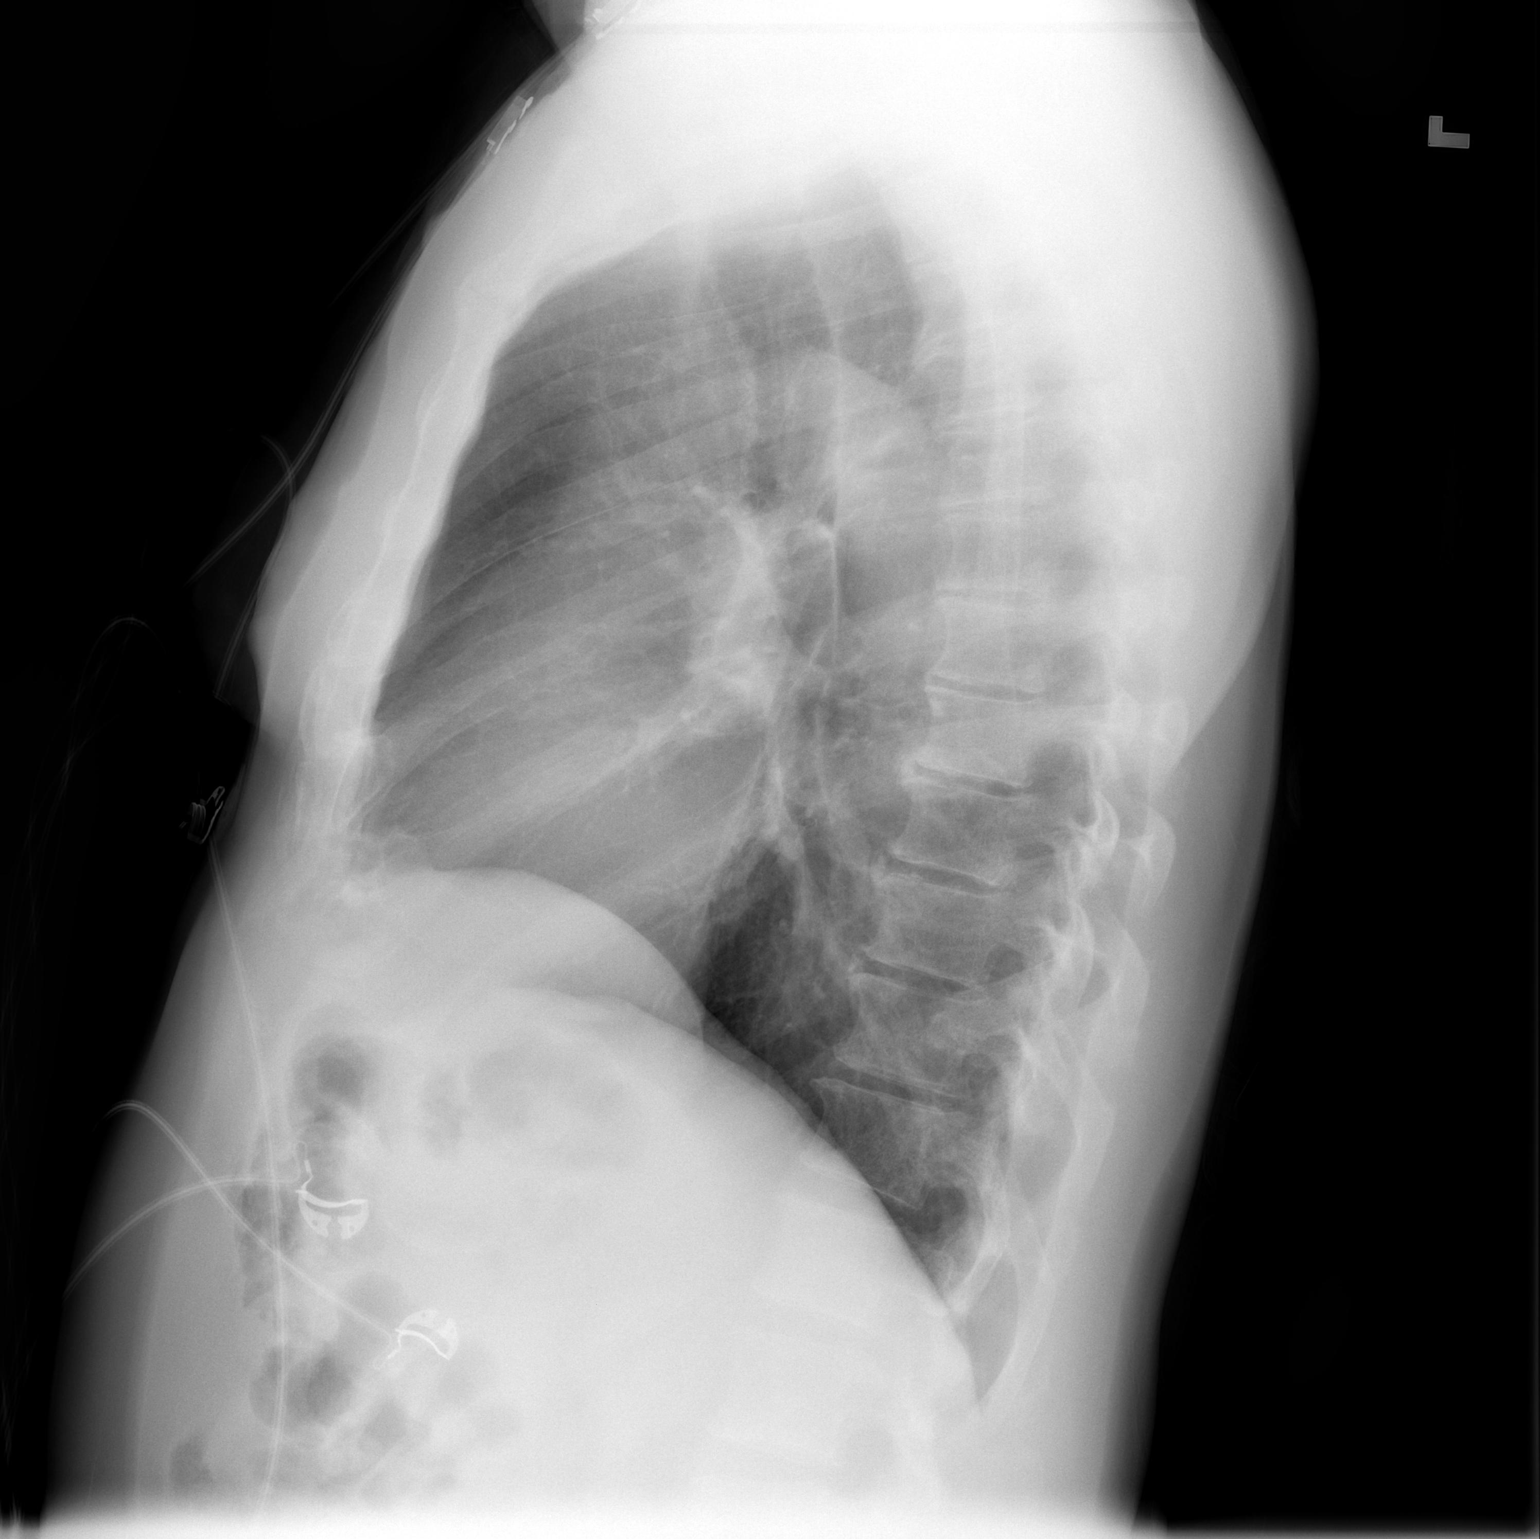

[2 of 2 positions shown; findings below may reference images not displayed]

FINDINGS: Two views of the chest demonstrate clear lungs. Heart and
mediastinum are within normal limits.  The trachea is midline.
Bony thorax is intact.
IMPRESSION: No acute chest findings.

## 2016-08-30 DIAGNOSIS — R143 Flatulence: Secondary | ICD-10-CM | POA: Diagnosis not present

## 2016-08-30 DIAGNOSIS — I10 Essential (primary) hypertension: Secondary | ICD-10-CM | POA: Diagnosis not present

## 2016-08-30 DIAGNOSIS — E785 Hyperlipidemia, unspecified: Secondary | ICD-10-CM | POA: Diagnosis not present

## 2017-09-12 DIAGNOSIS — I10 Essential (primary) hypertension: Secondary | ICD-10-CM | POA: Diagnosis not present

## 2017-09-12 DIAGNOSIS — E785 Hyperlipidemia, unspecified: Secondary | ICD-10-CM | POA: Diagnosis not present

## 2017-09-12 DIAGNOSIS — G4762 Sleep related leg cramps: Secondary | ICD-10-CM | POA: Diagnosis not present

## 2018-01-14 DIAGNOSIS — I10 Essential (primary) hypertension: Secondary | ICD-10-CM | POA: Diagnosis not present

## 2018-01-14 DIAGNOSIS — E785 Hyperlipidemia, unspecified: Secondary | ICD-10-CM | POA: Diagnosis not present

## 2018-01-14 DIAGNOSIS — L259 Unspecified contact dermatitis, unspecified cause: Secondary | ICD-10-CM | POA: Diagnosis not present

## 2018-01-14 DIAGNOSIS — M25422 Effusion, left elbow: Secondary | ICD-10-CM | POA: Diagnosis not present

## 2018-01-29 ENCOUNTER — Ambulatory Visit: Payer: 59 | Admitting: Orthopaedic Surgery

## 2018-01-29 ENCOUNTER — Other Ambulatory Visit: Payer: Self-pay | Admitting: Orthopaedic Surgery

## 2018-01-29 ENCOUNTER — Encounter: Payer: Self-pay | Admitting: Orthopaedic Surgery

## 2018-01-29 ENCOUNTER — Ambulatory Visit (INDEPENDENT_AMBULATORY_CARE_PROVIDER_SITE_OTHER): Payer: 59

## 2018-01-29 VITALS — BP 148/88 | HR 76 | Temp 98.2°F | Ht 69.0 in | Wt 200.0 lb

## 2018-01-29 DIAGNOSIS — M7021 Olecranon bursitis, right elbow: Secondary | ICD-10-CM

## 2018-01-29 DIAGNOSIS — M25521 Pain in right elbow: Secondary | ICD-10-CM | POA: Diagnosis not present

## 2018-01-29 DIAGNOSIS — M25522 Pain in left elbow: Secondary | ICD-10-CM

## 2018-01-29 NOTE — Progress Notes (Signed)
Subjective:    Patient ID: Corey Wright, male    DOB: [birth year], 62 y.o.   MRN: [id number]  HPI He fell about two to three weeks ago and hurt his right elbow.  He was seen at the [facility].  He developed swelling around the olecranon area.  He has full motion but has swelling of the bursa area.  He has no redness, no numbness.  The bursa remains swollen.   Review of Systems  Constitutional: Positive for activity change.  Musculoskeletal: Positive for arthralgias.   For Review of Systems, all other systems reviewed and are negative.  Past Medical History:  Diagnosis Date  . Hypercholesteremia   . Hypertension     Past Surgical History:  Procedure Laterality Date  . ESOPHAGOGASTRODUODENOSCOPY N/A 04/08/2013   Procedure: ESOPHAGOGASTRODUODENOSCOPY (EGD);  Surgeon: Theda Belfast, MD;  Location: [facility] ENDOSCOPY;  Service: Endoscopy;  Laterality: N/A;    Current Outpatient Medications on File Prior to Visit  Medication Sig Dispense Refill  . ferrous gluconate (FERGON) 216 MG tablet Take 1 tablet (216 mg total) by mouth 2 (two) times daily with a meal. 60 tablet 1  . lisinopril-hydrochlorothiazide (PRINZIDE,ZESTORETIC) 10-12.5 MG per tablet Take 1 tablet by mouth daily.    Marland Kitchen lovastatin (MEVACOR) 20 MG tablet Take 20 mg by mouth at bedtime.    . pantoprazole (PROTONIX) 40 MG tablet Take 1 tablet (40 mg total) by mouth 2 (two) times daily. Take twice a day for a month and then daily 60 tablet 4  . sucralfate (CARAFATE) 1 G tablet Take 1 tablet (1 g total) by mouth 4 (four) times daily -  before meals and at bedtime. For a month 120 tablet 1  . tadalafil (CIALIS) 5 MG tablet Take 5 mg by mouth daily as needed for erectile dysfunction.    . vitamin B-12 (CYANOCOBALAMIN) 1000 MCG tablet Take 1 tablet (1,000 mcg total) by mouth daily. 30 tablet 3   No current facility-administered medications on file prior to visit.     Social History   Socioeconomic History  . Marital status:  Married    Spouse name: Not on file  . Number of children: Not on file  . Years of education: Not on file  . Highest education level: Not on file  Occupational History  . Not on file  Social Needs  . Financial resource strain: Not on file  . Food insecurity:    Worry: Not on file    Inability: Not on file  . Transportation needs:    Medical: Not on file    Non-medical: Not on file  Tobacco Use  . Smoking status: Never Smoker  Substance and Sexual Activity  . Alcohol use: No  . Drug use: No  . Sexual activity: Yes  Lifestyle  . Physical activity:    Days per week: Not on file    Minutes per session: Not on file  . Stress: Not on file  Relationships  . Social connections:    Talks on phone: Not on file    Gets together: Not on file    Attends religious service: Not on file    Active member of club or organization: Not on file    Attends meetings of clubs or organizations: Not on file    Relationship status: Not on file  . Intimate partner violence:    Fear of current or ex partner: Not on file    Emotionally abused: Not on file  Physically abused: Not on file    Forced sexual activity: Not on file  Other Topics Concern  . Not on file  Social History Narrative  . Not on file    Family History  Problem Relation Age of Onset  . Diabetes Mellitus II Mother   . Seizures Father     BP (!) 148/88   Pulse 76   Temp 98.2 F (36.8 C)   Ht 5\' 9"  (1.753 m)   Wt 200 lb (90.7 kg)   BMI 29.53 kg/m   Body mass index is 29.53 kg/m.     Objective:   Physical Exam  Constitutional: He is oriented to person, place, and time. He appears well-developed and well-nourished.  HENT:  Head: Normocephalic and atraumatic.  Eyes: Pupils are equal, round, and reactive to light. Conjunctivae and EOM are normal.  Neck: Normal range of motion. Neck supple.  Cardiovascular: Normal rate, regular rhythm and intact distal pulses.  Pulmonary/Chest: Effort normal.  Abdominal: Soft.    Musculoskeletal:       Arms: Neurological: He is alert and oriented to person, place, and time. He has normal reflexes. He displays normal reflexes. No cranial nerve deficit. He exhibits normal muscle tone. Coordination normal.  Skin: Skin is warm and dry.  Psychiatric: He has a normal mood and affect. His behavior is normal. Judgment and thought content normal.     X-rays were done of the right elbow, reported separately.     Assessment & Plan:   Encounter Diagnoses  Name Primary?  Marland Kitchen. Olecranon bursitis of right elbow   . Left elbow pain Yes   Procedure note: After permission from the patient and after sterile prep of the right olecranon area, the bursa was aspirated of about 5 cc of bloody fluid and I instilled 1 cc DepoMedrol 40  by sterile technique tolerated well.  I have told him this will most likely reoccur.  A dressing was applied.  He may need surgical excision of the bursa if it bothers him.  Return in two weeks.  Call if any problem.  Precautions discussed.   Electronically Signed Darreld McleanWayne Machi Whittaker, MD 7/2/20199:05 AM

## 2018-02-14 ENCOUNTER — Encounter: Payer: Self-pay | Admitting: Orthopaedic Surgery

## 2018-02-14 ENCOUNTER — Ambulatory Visit: Payer: 59 | Admitting: Orthopaedic Surgery

## 2018-02-14 VITALS — BP 140/86 | HR 74 | Ht 69.0 in | Wt 200.0 lb

## 2018-02-14 DIAGNOSIS — M7021 Olecranon bursitis, right elbow: Secondary | ICD-10-CM

## 2018-02-14 NOTE — Progress Notes (Signed)
CC:  That place is all better  His right olecranon bursa has resolved.  He has no pain.  He has full ROM of the right elbow, no pain, no difficulty.  Bursa has returned to normal.  Encounter Diagnosis  Name Primary?  Marland Kitchen. Olecranon bursitis of right elbow Yes   I will see as needed.  Call if any problem.  Precautions discussed.   Electronically Signed Darreld McleanWayne Efrem Pitstick, MD 7/18/20199:00 AM

## 2018-04-22 DIAGNOSIS — E785 Hyperlipidemia, unspecified: Secondary | ICD-10-CM | POA: Diagnosis not present

## 2018-04-22 DIAGNOSIS — R0981 Nasal congestion: Secondary | ICD-10-CM | POA: Diagnosis not present

## 2018-04-22 DIAGNOSIS — I10 Essential (primary) hypertension: Secondary | ICD-10-CM | POA: Diagnosis not present

## 2018-08-06 DIAGNOSIS — Z125 Encounter for screening for malignant neoplasm of prostate: Secondary | ICD-10-CM | POA: Diagnosis not present

## 2018-08-06 DIAGNOSIS — Z6829 Body mass index (BMI) 29.0-29.9, adult: Secondary | ICD-10-CM | POA: Diagnosis not present

## 2018-08-06 DIAGNOSIS — I10 Essential (primary) hypertension: Secondary | ICD-10-CM | POA: Diagnosis not present

## 2018-08-06 DIAGNOSIS — K4091 Unilateral inguinal hernia, without obstruction or gangrene, recurrent: Secondary | ICD-10-CM | POA: Diagnosis not present

## 2018-11-11 DIAGNOSIS — E785 Hyperlipidemia, unspecified: Secondary | ICD-10-CM | POA: Diagnosis not present

## 2018-11-11 DIAGNOSIS — I10 Essential (primary) hypertension: Secondary | ICD-10-CM | POA: Diagnosis not present

## 2018-11-11 DIAGNOSIS — K4090 Unilateral inguinal hernia, without obstruction or gangrene, not specified as recurrent: Secondary | ICD-10-CM | POA: Diagnosis not present

## 2019-09-07 ENCOUNTER — Ambulatory Visit: Payer: Medicare Other | Attending: Internal Medicine

## 2019-09-07 ENCOUNTER — Other Ambulatory Visit: Payer: Self-pay

## 2019-09-07 DIAGNOSIS — Z23 Encounter for immunization: Secondary | ICD-10-CM

## 2019-09-07 NOTE — Progress Notes (Signed)
   Covid-19 Vaccination Clinic  Name:  Corey Wright    MRN: [id number] DOB: [birth year]  09/07/2019  Corey Wright was observed post Covid-19 immunization for 15 minutes without incidence. He was provided with Vaccine Information Sheet and instruction to access the V-Safe system.   Corey Wright was instructed to call 911 with any severe reactions post vaccine: Marland Kitchen Difficulty breathing  . Swelling of your face and throat  . A fast heartbeat  . A bad rash all over your body  . Dizziness and weakness    Immunizations Administered    Name Date Dose VIS Date Route   Moderna COVID-19 Vaccine 09/07/2019  3:38 PM 0.5 mL 07/01/2019 Intramuscular   Manufacturer: Moderna   Lot: [id number]   NDC: 33882-666-64

## 2019-10-08 ENCOUNTER — Ambulatory Visit: Payer: Medicare Other | Attending: Internal Medicine

## 2019-10-08 DIAGNOSIS — Z23 Encounter for immunization: Secondary | ICD-10-CM | POA: Insufficient documentation

## 2019-10-08 NOTE — Progress Notes (Signed)
   Covid-19 Vaccination Clinic  Name:  Corey Wright    MRN: [id number] DOB: 12/17/1955  10/08/2019  Corey Wright was observed post Covid-19 immunization for 15 minutes without incident. He was provided with Vaccine Information Sheet and instruction to access the V-Safe system.   Corey Wright was instructed to call 911 with any severe reactions post vaccine: Marland Kitchen Difficulty breathing  . Swelling of face and throat  . A fast heartbeat  . A bad rash all over body  . Dizziness and weakness   Immunizations Administered    Name Date Dose VIS Date Route   Moderna COVID-19 Vaccine 10/08/2019  1:17 PM 0.5 mL 07/01/2019 Intramuscular   Manufacturer: Moderna   Lot: [id number]   NDC: 94098-286-75
